# Patient Record
Sex: Male | Born: 1985 | Race: White | Hispanic: No | Marital: Single | Smoking: Current every day smoker
Health system: Southern US, Community
[De-identification: ages and names within clinical notes are randomized; demographics above are authoritative.]

---

## 2006-02-25 ENCOUNTER — Emergency Department (HOSPITAL_COMMUNITY): Admission: EM | Admit: 2006-02-25 | Discharge: 2006-02-26 | Payer: Self-pay | Admitting: Emergency Medicine

## 2006-03-06 ENCOUNTER — Emergency Department (HOSPITAL_COMMUNITY): Admission: EM | Admit: 2006-03-06 | Discharge: 2006-03-06 | Payer: Self-pay | Admitting: Emergency Medicine

## 2006-10-15 ENCOUNTER — Emergency Department (HOSPITAL_COMMUNITY): Admission: EM | Admit: 2006-10-15 | Discharge: 2006-10-15 | Payer: Self-pay | Admitting: Emergency Medicine

## 2006-10-18 ENCOUNTER — Emergency Department (HOSPITAL_COMMUNITY): Admission: EM | Admit: 2006-10-18 | Discharge: 2006-10-18 | Payer: Self-pay | Admitting: Emergency Medicine

## 2006-12-30 ENCOUNTER — Emergency Department (HOSPITAL_COMMUNITY): Admission: EM | Admit: 2006-12-30 | Discharge: 2006-12-30 | Payer: Self-pay | Admitting: Emergency Medicine

## 2007-03-07 ENCOUNTER — Emergency Department (HOSPITAL_COMMUNITY): Admission: EM | Admit: 2007-03-07 | Discharge: 2007-03-07 | Payer: Self-pay | Admitting: *Deleted

## 2007-03-11 ENCOUNTER — Emergency Department (HOSPITAL_COMMUNITY): Admission: EM | Admit: 2007-03-11 | Discharge: 2007-03-11 | Payer: Self-pay | Admitting: Emergency Medicine

## 2007-03-12 ENCOUNTER — Emergency Department (HOSPITAL_COMMUNITY): Admission: EM | Admit: 2007-03-12 | Discharge: 2007-03-12 | Payer: Self-pay | Admitting: Emergency Medicine

## 2007-08-28 ENCOUNTER — Emergency Department (HOSPITAL_COMMUNITY): Admission: EM | Admit: 2007-08-28 | Discharge: 2007-08-28 | Payer: Self-pay | Admitting: Emergency Medicine

## 2007-09-27 ENCOUNTER — Emergency Department (HOSPITAL_COMMUNITY): Admission: EM | Admit: 2007-09-27 | Discharge: 2007-09-27 | Payer: Self-pay | Admitting: Emergency Medicine

## 2008-04-06 ENCOUNTER — Emergency Department (HOSPITAL_COMMUNITY): Admission: EM | Admit: 2008-04-06 | Discharge: 2008-04-06 | Payer: Self-pay | Admitting: Emergency Medicine

## 2008-07-03 ENCOUNTER — Emergency Department (HOSPITAL_COMMUNITY): Admission: EM | Admit: 2008-07-03 | Discharge: 2008-07-03 | Payer: Self-pay | Admitting: Emergency Medicine

## 2009-03-09 ENCOUNTER — Emergency Department (HOSPITAL_COMMUNITY): Admission: EM | Admit: 2009-03-09 | Discharge: 2009-03-09 | Payer: Self-pay | Admitting: Emergency Medicine

## 2009-09-21 ENCOUNTER — Emergency Department (HOSPITAL_COMMUNITY): Admission: EM | Admit: 2009-09-21 | Discharge: 2009-09-21 | Payer: Self-pay | Admitting: Emergency Medicine

## 2010-11-21 LAB — D-DIMER, QUANTITATIVE: D-Dimer, Quant: 0.22 ug/mL-FEU (ref 0.00–0.48)

## 2010-12-31 ENCOUNTER — Emergency Department: Payer: Self-pay | Admitting: Emergency Medicine

## 2011-05-17 LAB — STREP A DNA PROBE: Group A Strep Probe: NEGATIVE

## 2011-05-17 LAB — RAPID STREP SCREEN (MED CTR MEBANE ONLY): Streptococcus, Group A Screen (Direct): NEGATIVE

## 2011-05-30 LAB — HEPATIC FUNCTION PANEL
Indirect Bilirubin: 0.8
Total Protein: 6.9

## 2011-05-30 LAB — I-STAT 8, (EC8 V) (CONVERTED LAB)
Acid-Base Excess: 1
Chloride: 103
HCT: 53 — ABNORMAL HIGH
Operator id: 285841
Sodium: 138
TCO2: 26
pCO2, Ven: 36.7 — ABNORMAL LOW
pH, Ven: 7.442 — ABNORMAL HIGH

## 2011-05-30 LAB — URINALYSIS, ROUTINE W REFLEX MICROSCOPIC
Glucose, UA: NEGATIVE
Hgb urine dipstick: NEGATIVE
Ketones, ur: NEGATIVE
Specific Gravity, Urine: 1.015

## 2011-05-30 LAB — RAPID URINE DRUG SCREEN, HOSP PERFORMED
Amphetamines: NOT DETECTED
Benzodiazepines: POSITIVE — AB
Cocaine: NOT DETECTED

## 2011-05-30 LAB — PROTIME-INR
INR: 1
Prothrombin Time: 13.8

## 2011-05-30 LAB — POCT I-STAT CREATININE: Operator id: 285841

## 2011-05-30 LAB — TRICYCLICS SCREEN, URINE: TCA Scrn: NOT DETECTED

## 2011-05-30 LAB — ACETAMINOPHEN LEVEL: Acetaminophen (Tylenol), Serum: <10 — ABNORMAL LOW

## 2012-04-13 ENCOUNTER — Emergency Department (HOSPITAL_COMMUNITY): Payer: Self-pay

## 2012-04-13 ENCOUNTER — Emergency Department (HOSPITAL_COMMUNITY)
Admission: EM | Admit: 2012-04-13 | Discharge: 2012-04-13 | Disposition: A | Discharge status: Home / Self Care | Payer: Self-pay | Attending: Emergency Medicine | Admitting: Emergency Medicine

## 2012-04-13 ENCOUNTER — Encounter (HOSPITAL_COMMUNITY): Payer: Self-pay | Admitting: *Deleted

## 2012-04-13 DIAGNOSIS — M7989 Other specified soft tissue disorders: Secondary | ICD-10-CM | POA: Insufficient documentation

## 2012-04-13 DIAGNOSIS — S9030XA Contusion of unspecified foot, initial encounter: Secondary | ICD-10-CM

## 2012-04-13 MED ORDER — IBUPROFEN 800 MG PO TABS
800.0000 mg | ORAL_TABLET | Freq: Once | ORAL | Status: AC
  Administered 2012-04-13: 800 mg via ORAL
  Filled 2012-04-13: qty 1

## 2012-04-13 MED ORDER — TRAMADOL HCL 50 MG PO TABS: 50.0000 mg | ORAL_TABLET | Freq: Four times a day (QID) | ORAL | Status: AC | PRN

## 2012-04-13 NOTE — ED Notes (Signed)
Pt reports he was chasing after his kids last night, decided to take a short cut and jumped over the rail of his deck about 18ft-he thinks he landed on his L heel.  Pt reports L foot pain-mild swelling noted.  Pt reports non-weight bearing.

## 2012-04-13 NOTE — ED Provider Notes (Signed)
History     CSN: [id number]  Arrival date & time 04/13/12  1032   First MD Initiated Contact with Patient 04/13/12 1051      Chief Complaint  Patient presents with  . Foot Injury    (Consider location/radiation/quality/duration/timing/severity/associated sxs/prior treatment) HPI Comments: Playing with children last PM.  Was barefooted and jumped over a deck railing and landed on L heel.  Has had pain since.  Unable to bear weight.  Not taking any meds.  No PCP or orthopedist.  No other injuries or complaints.  The history is provided by the patient. No language interpreter was used.    History reviewed. No pertinent past medical history.  History reviewed. No pertinent past surgical history.  No family history on file.  History  Substance Use Topics  . Smoking status: Current Everyday Smoker -- 0.5 packs/day  . Smokeless tobacco: Not on file  . Alcohol Use: Yes     occa      Review of Systems  Musculoskeletal:       Foot injury  Skin: Negative for wound.  All other systems reviewed and are negative.    Allergies  Review of patient's allergies indicates no known allergies.  Home Medications   Current Outpatient Rx  Name Route Sig Dispense Refill  . ADULT MULTIVITAMIN W/MINERALS CH Oral Take 1 tablet by mouth daily.    . TRAMADOL HCL 50 MG PO TABS Oral Take 1 tablet (50 mg total) by mouth every 6 (six) hours as needed for pain. 20 tablet 0    BP 128/80  Pulse 96  Temp 98.4 F (36.9 C) (Oral)  Resp 20  SpO2 98%  Physical Exam  Nursing note and vitals reviewed. Constitutional: He is oriented to person, place, and time. He appears well-developed and well-nourished.  HENT:  Head: Normocephalic and atraumatic.  Eyes: EOM are normal.  Neck: Normal range of motion.  Cardiovascular: Normal rate, regular rhythm, normal heart sounds and intact distal pulses.   Pulmonary/Chest: Effort normal and breath sounds normal. No respiratory distress.  Abdominal: Soft.  He exhibits no distension. There is no tenderness.  Musculoskeletal: He exhibits tenderness.       Left foot: He exhibits decreased range of motion, tenderness and bony tenderness. He exhibits no swelling, normal capillary refill, no crepitus, no deformity and no laceration.       Feet:  Neurological: He is alert and oriented to person, place, and time.  Skin: Skin is warm and dry.  Psychiatric: He has a normal mood and affect. Judgment normal.    ED Course  Procedures (including critical care time)  Labs Reviewed - No data to display Dg Foot Complete Left  04/13/2012  *RADIOLOGY REPORT*  Clinical Data: Heel pain.  LEFT FOOT - COMPLETE 3+ VIEW  Technique: Three views of the left foot.  Comparison:  None.  Findings: Anatomic alignment.  No fracture.  Soft tissues appear within normal limits.  Calcaneus appears normal.  IMPRESSION: Negative.   Original Report Authenticated By: Andreas Newport, M.D.      1. Contusion of heel       MDM  No fxs Ice, crutches rx-tramadol ibuprofen         Evalina Field, [place] 05/03/12 0050

## 2012-04-13 NOTE — ED Notes (Signed)
Pls see triage note.  Pt also reports pain is worse when his L foot is on the floor.  Pain is better with foot elevated.

## 2012-04-21 ENCOUNTER — Emergency Department: Payer: Self-pay | Admitting: Emergency Medicine

## 2012-05-03 NOTE — ED Provider Notes (Signed)
Medical screening examination/treatment/procedure(s) were performed by non-physician practitioner and as supervising physician I was immediately available for consultation/collaboration.  Korah Hufstedler R. Rishawn Walck, MD 05/03/12 1102 

## 2012-06-07 ENCOUNTER — Emergency Department: Payer: Self-pay | Admitting: Internal Medicine

## 2012-07-03 ENCOUNTER — Emergency Department: Payer: Self-pay | Admitting: Emergency Medicine

## 2013-06-09 ENCOUNTER — Emergency Department (HOSPITAL_COMMUNITY): Payer: Self-pay

## 2013-06-09 ENCOUNTER — Emergency Department (HOSPITAL_COMMUNITY)
Admission: EM | Admit: 2013-06-09 | Discharge: 2013-06-09 | Disposition: A | Discharge status: Home / Self Care | Payer: Self-pay | Attending: Emergency Medicine | Admitting: Emergency Medicine

## 2013-06-09 ENCOUNTER — Encounter (HOSPITAL_COMMUNITY): Payer: Self-pay | Admitting: Emergency Medicine

## 2013-06-09 DIAGNOSIS — Z23 Encounter for immunization: Secondary | ICD-10-CM | POA: Insufficient documentation

## 2013-06-09 DIAGNOSIS — T50905A Adverse effect of unspecified drugs, medicaments and biological substances, initial encounter: Secondary | ICD-10-CM

## 2013-06-09 DIAGNOSIS — Y929 Unspecified place or not applicable: Secondary | ICD-10-CM | POA: Insufficient documentation

## 2013-06-09 DIAGNOSIS — X58XXXA Exposure to other specified factors, initial encounter: Secondary | ICD-10-CM | POA: Insufficient documentation

## 2013-06-09 DIAGNOSIS — T400X1A Poisoning by opium, accidental (unintentional), initial encounter: Secondary | ICD-10-CM | POA: Insufficient documentation

## 2013-06-09 DIAGNOSIS — Y939 Activity, unspecified: Secondary | ICD-10-CM | POA: Insufficient documentation

## 2013-06-09 DIAGNOSIS — IMO0002 Reserved for concepts with insufficient information to code with codable children: Secondary | ICD-10-CM | POA: Insufficient documentation

## 2013-06-09 MED ORDER — TETANUS-DIPHTH-ACELL PERTUSSIS 5-2.5-18.5 LF-MCG/0.5 IM SUSP
0.5000 mL | Freq: Once | INTRAMUSCULAR | Status: AC
  Administered 2013-06-09: 0.5 mL via INTRAMUSCULAR
  Filled 2013-06-09: qty 0.5

## 2013-06-09 NOTE — ED Notes (Signed)
Patient is alert and oriented x3.  He was given DC instructions and follow up visit instructions.  Patient gave verbal understanding.  He was DC ambulatory under his own power to home.  V/S stable.  He was not showing any signs of distress on DC 

## 2013-06-09 NOTE — ED Notes (Signed)
Patient denies SI or HI.  He states that a friend gave him some pills and he took it along with 4 24oz beers.  He states that he has never taken any drugs or had this happen before.

## 2013-06-09 NOTE — ED Notes (Signed)
Patient refused all labs. 

## 2013-06-09 NOTE — ED Notes (Signed)
Patient is alert and oriented x3.  He states that he took pills from a friend.   EMS gave patient 2 of narcan with good results.  EMS placed a nasal trumpet.

## 2013-06-09 NOTE — ED Notes (Signed)
Pt. Refused lab draw. RN, Topher made aware.
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
## 2013-06-10 NOTE — ED Provider Notes (Signed)
Medical screening examination/treatment/procedure(s) were performed by non-physician practitioner and as supervising physician I was immediately available for consultation/collaboration.   Lincy Belles, MD 06/10/13 1112 

## 2014-09-28 ENCOUNTER — Encounter (HOSPITAL_COMMUNITY): Payer: Self-pay | Admitting: Emergency Medicine

## 2014-09-28 ENCOUNTER — Emergency Department (HOSPITAL_COMMUNITY)
Admission: EM | Admit: 2014-09-28 | Discharge: 2014-09-28 | Disposition: A | Discharge status: Home / Self Care | Payer: Self-pay | Attending: Emergency Medicine | Admitting: Emergency Medicine

## 2014-09-28 ENCOUNTER — Emergency Department (HOSPITAL_COMMUNITY): Payer: Self-pay

## 2014-09-28 DIAGNOSIS — Y92410 Unspecified street and highway as the place of occurrence of the external cause: Secondary | ICD-10-CM | POA: Insufficient documentation

## 2014-09-28 DIAGNOSIS — Y998 Other external cause status: Secondary | ICD-10-CM | POA: Insufficient documentation

## 2014-09-28 DIAGNOSIS — Y9389 Activity, other specified: Secondary | ICD-10-CM | POA: Insufficient documentation

## 2014-09-28 DIAGNOSIS — Z720 Tobacco use: Secondary | ICD-10-CM | POA: Insufficient documentation

## 2014-09-28 DIAGNOSIS — S29001A Unspecified injury of muscle and tendon of front wall of thorax, initial encounter: Secondary | ICD-10-CM | POA: Insufficient documentation

## 2014-09-28 DIAGNOSIS — M549 Dorsalgia, unspecified: Secondary | ICD-10-CM

## 2014-09-28 DIAGNOSIS — S3992XA Unspecified injury of lower back, initial encounter: Secondary | ICD-10-CM | POA: Insufficient documentation

## 2014-09-28 MED ORDER — HYDROMORPHONE HCL 1 MG/ML IJ SOLN
1.0000 mg | Freq: Once | INTRAMUSCULAR | Status: AC
  Administered 2014-09-28: 1 mg via INTRAMUSCULAR
  Filled 2014-09-28: qty 1

## 2014-09-28 MED ORDER — NAPROXEN 500 MG PO TABS: 500.0000 mg | ORAL_TABLET | Freq: Two times a day (BID) | ORAL | Status: DC

## 2014-09-28 MED ORDER — CYCLOBENZAPRINE HCL 10 MG PO TABS: 10.0000 mg | ORAL_TABLET | Freq: Two times a day (BID) | ORAL | Status: DC | PRN
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
## 2014-09-28 NOTE — Discharge Instructions (Signed)
X-ray was normal. You'll be sore for several days. Prescriptions for pain medicine and muscle relaxer.
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
## 2016-11-09 ENCOUNTER — Encounter (HOSPITAL_COMMUNITY): Payer: Self-pay | Admitting: Emergency Medicine

## 2016-11-09 ENCOUNTER — Emergency Department (HOSPITAL_COMMUNITY): Payer: Self-pay

## 2016-11-09 ENCOUNTER — Emergency Department (HOSPITAL_COMMUNITY)
Admission: EM | Admit: 2016-11-09 | Discharge: 2016-11-09 | Disposition: A | Discharge status: Home / Self Care | Payer: Self-pay | Attending: Emergency Medicine | Admitting: Emergency Medicine

## 2016-11-09 DIAGNOSIS — F17200 Nicotine dependence, unspecified, uncomplicated: Secondary | ICD-10-CM | POA: Insufficient documentation

## 2016-11-09 DIAGNOSIS — R109 Unspecified abdominal pain: Secondary | ICD-10-CM | POA: Insufficient documentation

## 2016-11-09 LAB — CBC
HEMATOCRIT: 45.4 % (ref 39.0–52.0)
HEMOGLOBIN: 15.4 g/dL (ref 13.0–17.0)
MCH: 29.8 pg (ref 26.0–34.0)
MCHC: 33.9 g/dL (ref 30.0–36.0)
MCV: 87.8 fL (ref 78.0–100.0)
Platelets: 259 10*3/uL (ref 150–400)
RBC: 5.17 MIL/uL (ref 4.22–5.81)
RDW: 12.9 % (ref 11.5–15.5)
WBC: 6.9 10*3/uL (ref 4.0–10.5)

## 2016-11-09 LAB — COMPREHENSIVE METABOLIC PANEL
ALK PHOS: 87 U/L (ref 38–126)
ALT: 147 U/L — ABNORMAL HIGH (ref 17–63)
ANION GAP: 9 (ref 5–15)
AST: 53 U/L — ABNORMAL HIGH (ref 15–41)
Albumin: 4.1 g/dL (ref 3.5–5.0)
BILIRUBIN TOTAL: 0.7 mg/dL (ref 0.3–1.2)
BUN: 11 mg/dL (ref 6–20)
CALCIUM: 9.2 mg/dL (ref 8.9–10.3)
CO2: 27 mmol/L (ref 22–32)
Chloride: 104 mmol/L (ref 101–111)
Creatinine, Ser: 1.12 mg/dL (ref 0.61–1.24)
GFR calc Af Amer: >60 mL/min (ref >60)
GFR calc non Af Amer: >60 mL/min (ref >60)
Glucose, Bld: 115 mg/dL — ABNORMAL HIGH (ref 65–99)
POTASSIUM: 3.7 mmol/L (ref 3.5–5.1)
Sodium: 140 mmol/L (ref 135–145)
Total Protein: 7.2 g/dL (ref 6.5–8.1)

## 2016-11-09 LAB — URINALYSIS, ROUTINE W REFLEX MICROSCOPIC
BACTERIA UA: NONE SEEN
BILIRUBIN URINE: NEGATIVE
Glucose, UA: NEGATIVE mg/dL
Ketones, ur: NEGATIVE mg/dL
LEUKOCYTES UA: NEGATIVE
Nitrite: NEGATIVE
PH: 5 (ref 5.0–8.0)
Protein, ur: 30 mg/dL — AB
Specific Gravity, Urine: 1.028 (ref 1.005–1.030)
Squamous Epithelial / LPF: NONE SEEN

## 2016-11-09 LAB — LIPASE, BLOOD: LIPASE: 20 U/L (ref 11–51)

## 2016-11-09 MED ORDER — CEPHALEXIN 500 MG PO CAPS: 500.0000 mg | ORAL_CAPSULE | Freq: Four times a day (QID) | ORAL | 0 refills | Status: AC

## 2016-11-09 MED ORDER — ACETAMINOPHEN 325 MG PO TABS
650.0000 mg | ORAL_TABLET | Freq: Once | ORAL | Status: AC
  Administered 2016-11-09: 650 mg via ORAL
  Filled 2016-11-09: qty 2

## 2016-11-09 MED ORDER — KETOROLAC TROMETHAMINE 60 MG/2ML IM SOLN
60.0000 mg | Freq: Once | INTRAMUSCULAR | Status: AC
Start: 2016-11-09 — End: 2016-11-09
  Administered 2016-11-09: 60 mg via INTRAMUSCULAR
  Filled 2016-11-09: qty 2

## 2016-11-09 MED ORDER — ONDANSETRON 4 MG PO TBDP
ORAL_TABLET | ORAL | Status: AC
  Filled 2016-11-09: qty 1

## 2016-11-09 MED ORDER — ONDANSETRON 4 MG PO TBDP
4.0000 mg | ORAL_TABLET | Freq: Once | ORAL | Status: AC | PRN
  Administered 2016-11-09: 4 mg via ORAL

## 2016-11-09 NOTE — ED Triage Notes (Signed)
Pt sts bilateral flank pain and foul smelling urine x 1 week; pt sts N/V

## 2016-11-09 NOTE — ED Notes (Signed)
Pt requesting pain medicine before he is DC. Homero FellersFrank PA aware.

## 2016-11-09 NOTE — ED Provider Notes (Signed)
[facility]-EMERGENCY DEPT Provider Note   CSN: [id number] Arrival date & time: 11/09/16  1504     History   Chief Complaint Chief Complaint  Patient presents with  . Flank Pain  . Emesis    HPI Leonard Malone. is a 31 y.o. male presents today with complaints of bilateral flank pain. He describes his pain on left flank as strong achy, worsening, constant, 8/10, with random sharp "twinge" x 1 week. He reports his right flank pain is dull, unchanged, constant, 4/10 pains x 2 days. He reports associated dark, foul smelling urine x 3 weeks. He reports associated nausea, vomiting 2 episodes, decreased intake. He denies fevers, chills, chest pain, shortness of breath, dysuria.  He does admit to suprapubic dullness that started today while moving around. He denies that feeling at this time. He's not sure if there was any blood in urine. He states he tried ibuprofen with moderate relief. He states he finds it difficult to find a comfortable position.  He denies taking any new medications. He denies any changes in physical activity. He denies exercise. He admits to recent smoking cessation. He denies hx of kidney stones, UTI. He admits to drinking about a 6 pack a day for 5-6 months last year, but states he no longer does that and last drink was about 6 weeks ago. He admits to marijuana use about 5-6 months ago and no other recent drug use. He admits to laying carpet for several years. He denies any abdominal surgeries. Patient denies recent STDs, recent unprotected sexual intercourse, penile pain, penile discharge. No recent unintentional weight loss.   The history is provided by the patient. No language interpreter was used.    History reviewed. No pertinent past medical history.  There are no active problems to display for this patient.   History reviewed. No pertinent surgical history.     Home Medications    Prior to Admission medications   Medication Sig Start Date End Date Taking?  Authorizing Provider  acetaminophen (TYLENOL) 500 MG tablet Take 1,000 mg by mouth every 6 (six) hours as needed for moderate pain.   Yes Historical Provider, MD  ibuprofen (ADVIL,MOTRIN) 200 MG tablet Take 400 mg by mouth every 6 (six) hours as needed.   Yes Historical Provider, MD  cephALEXin (KEFLEX) 500 MG capsule Take 1 capsule (500 mg total) by mouth 4 (four) times daily. 11/09/16 11/19/16  Lovey Crupi Orson Al[place], [place]    Family History History reviewed. No pertinent family history.  Social History Social History  Substance Use Topics  . Smoking status: Current Every Day Smoker    Packs/day: 0.50  . Smokeless tobacco: Not on file  . Alcohol use Yes     Comment: occa     Allergies   Patient has no known allergies.   Review of Systems Review of Systems  Constitutional: Positive for appetite change. Negative for chills and fever.  Respiratory: Negative for shortness of breath.   Cardiovascular: Negative for chest pain.  Gastrointestinal: Positive for abdominal pain. Negative for diarrhea, nausea and vomiting.  Genitourinary: Positive for frequency. Negative for difficulty urinating and dysuria.  Skin: Negative for wound.  All other systems reviewed and are negative.    Physical Exam Updated Vital Signs BP 120/80 (BP Location: Right Arm)   Pulse 75   Temp 99.6 F (37.6 C) (Oral)   Resp 18   Ht 6\' 1"  (1.854 m)   Wt 74.8 kg   SpO2 98%   BMI 21.77  kg/m   Physical Exam  Constitutional: He appears well-developed and well-nourished.  Well appearing  HENT:  Head: Normocephalic and atraumatic.  Nose: Nose normal.  Mouth/Throat: Oropharynx is clear and moist.  Eyes: Conjunctivae and EOM are normal.  Neck: Normal range of motion.  Cardiovascular: Normal rate, normal heart sounds and intact distal pulses.   No murmur heard. Pulmonary/Chest: Effort normal and breath sounds normal. No respiratory distress. He has no wheezes. He has no rales.  Normal work of breathing. No  respiratory distress noted.   Abdominal: Soft. There is tenderness. There is no rebound and no guarding.  Soft and tender to midepigastric region. Negative murphy's sign. No focal tenderness to mcburney's point. CVA tenderness noted to left side. No CVA tenderness to right side.   Musculoskeletal: Normal range of motion.  Neurological: He is alert.  Skin: Skin is warm.  Psychiatric: He has a normal mood and affect. His behavior is normal.  Nursing note and vitals reviewed.    ED Treatments / Results  Labs (all labs ordered are listed, but only abnormal results are displayed) Labs Reviewed  COMPREHENSIVE METABOLIC PANEL - Abnormal; Notable for the following:       Result Value   Glucose, Bld 115 (*)    AST 53 (*)    ALT 147 (*)    All other components within normal limits  URINALYSIS, ROUTINE W REFLEX MICROSCOPIC - Abnormal; Notable for the following:    Color, Urine AMBER (*)    Hgb urine dipstick LARGE (*)    Protein, ur 30 (*)    All other components within normal limits  URINE CULTURE  CBC  LIPASE, BLOOD    EKG  EKG Interpretation None       Radiology Ct Renal Stone Study  Result Date: 11/09/2016 CLINICAL DATA:  Acute onset of left flank pain, nausea and vomiting. Initial encounter. EXAM: CT ABDOMEN AND PELVIS WITHOUT CONTRAST TECHNIQUE: Multidetector CT imaging of the abdomen and pelvis was performed following the standard protocol without IV contrast. COMPARISON:  Pelvic radiograph performed 06/09/2013 FINDINGS: Lower chest: The visualized lung bases are grossly clear. The visualized portions of the mediastinum are unremarkable. Hepatobiliary: The liver is unremarkable in appearance. The gallbladder is unremarkable in appearance. The common bile duct remains normal in caliber. Pancreas: The pancreas is within normal limits. Spleen: The spleen is unremarkable in appearance. Adrenals/Urinary Tract: The adrenal glands are unremarkable in appearance. The kidneys are within  normal limits. There is no evidence of hydronephrosis. No renal or ureteral stones are identified. No perinephric stranding is seen. Stomach/Bowel: The stomach is unremarkable in appearance. The small bowel is within normal limits. The appendix is normal in caliber, without evidence of appendicitis. The colon is unremarkable in appearance. Vascular/Lymphatic: The abdominal aorta is unremarkable in appearance. The inferior vena cava is grossly unremarkable. No retroperitoneal lymphadenopathy is seen. No pelvic sidewall lymphadenopathy is identified. Reproductive: The bladder is mildly distended and grossly unremarkable. The prostate remains normal in size. Other: No significant soft tissue abnormalities are seen. Musculoskeletal: No acute osseous abnormalities are identified. The visualized musculature is unremarkable in appearance. IMPRESSION: Unremarkable noncontrast CT of the abdomen and pelvis. Electronically Signed   By: Roanna RaiderJeffery  Chang M.D.   On: 11/09/2016 21:21    Procedures Procedures (including critical care time)  Medications Ordered in ED Medications  ondansetron (ZOFRAN-ODT) disintegrating tablet 4 mg (4 mg Oral Given 11/09/16 1534)  ketorolac (TORADOL) injection 60 mg (60 mg Intramuscular Given 11/09/16 2002)  acetaminophen (TYLENOL)  tablet 650 mg (650 mg Oral Given 11/09/16 2210)     Initial Impression / Assessment and Plan / ED Course  I have reviewed the triage vital signs and the nursing notes.  Pertinent labs & imaging results that were available during my care of the patient were reviewed by me and considered in my medical decision making (see chart for details).    Patient here with flank pain, possible cystitis vs pyelonephritis. CT negative for kidney stone or other pathology at this time. Low suspicion for bladder CA, rhabdomyolysis, STD at this time. He is in no apparent distress, afebrile, hemodynamically stable. Patient here with mild tenderness to epigastric region. Mild  CVA tenderness to left. UA does show blood in urine. Urine culture sent. He denied any dysuria, penile pain, penile discharge, recent STDs, recent exposure to STDs. No chest pain, SOB, diaphoresis. Mild increase in AST and ALT. Patient will be given antibiotics to go home with strict instructions to follow-up with urologist for further evaluation and possible cystoscopy. Pain managed here in ED. I feel patient is safe for discharge at this time. Patient in no apparent distress, hemodynamically stable. Reasons to immediately return to ED discussed. Patient case discussed with Dr. Deretha Emory who agreed with assessment and plan.    Final Clinical Impressions(s) / ED Diagnoses   Final diagnoses:  Flank pain    New Prescriptions Discharge Medication List as of 11/09/2016  9:47 PM    START taking these medications   Details  cephALEXin (KEFLEX) 500 MG capsule Take 1 capsule (500 mg total) by mouth 4 (four) times daily., Starting Wed 11/09/2016, Until Sat 11/19/2016, Print         [address] [place], [place] 11/10/16 0017    Vanetta Mulders, MD 11/11/16 [phone]

## 2016-11-09 NOTE — Discharge Instructions (Signed)
Please take Keflex 4 times a day for 10 days. Please follow up with urologist tomorrow regarding today's visit. Please drink plenty of water throughout the day at least a glass of water. Take ibuprofen or naproxen as needed for pain relief with food.  Get help right away if: You are unable to take your antibiotics or fluids. You have shaking chills. You vomit. You have severe flank or back pain. You have extreme weakness or fainting.

## 2016-11-11 LAB — URINE CULTURE

## 2017-06-07 IMAGING — CT CT RENAL STONE PROTOCOL
2 of 4 series · 15 of 46 positions shown, 17 images · non-contrast
Comparison: Pelvic radiograph performed 06/09/2013

CLINICAL DATA: Acute onset of left flank pain, nausea and vomiting.
Initial encounter.

EXAM:
CT ABDOMEN AND PELVIS WITHOUT CONTRAST
TECHNIQUE: Multidetector CT imaging of the abdomen and pelvis was performed
following the standard protocol without IV contrast.

[Series 3: stone study 5.0 i30f 2 · axial · 0.63mm/px · z∈[+822,+1292]mm · 12 of 104 slices shown, 14 images]
[im 5/104  soft-tissue]
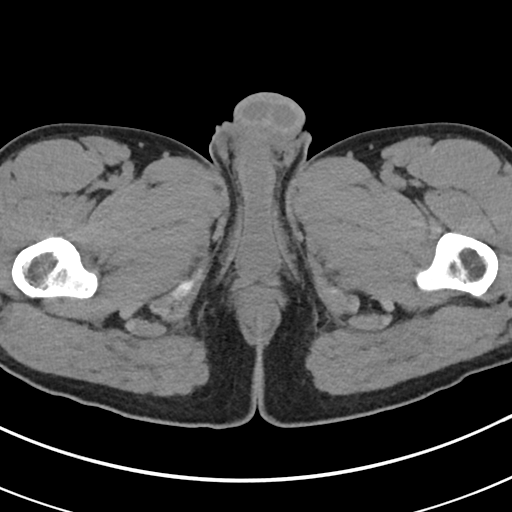
[im 5/104  bone]
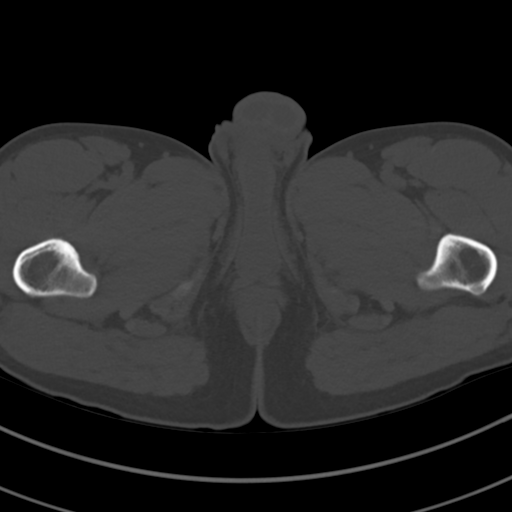
[im 13/104  soft-tissue]
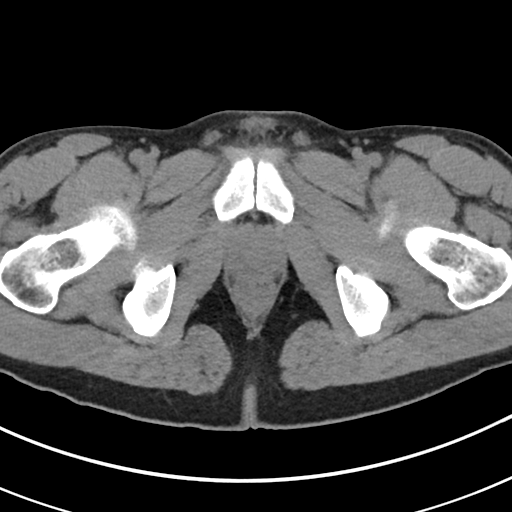
[im 22/104  soft-tissue]
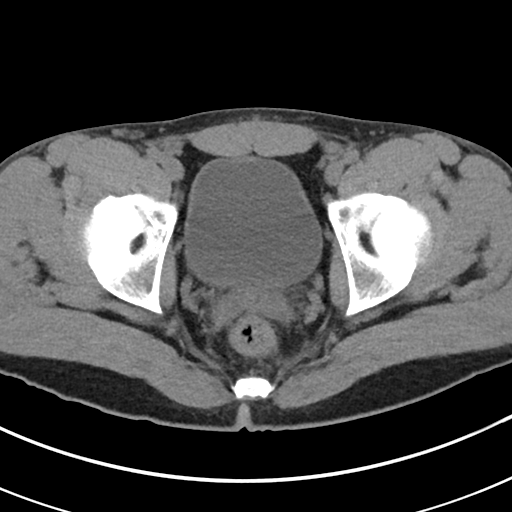
[im 31/104  soft-tissue]
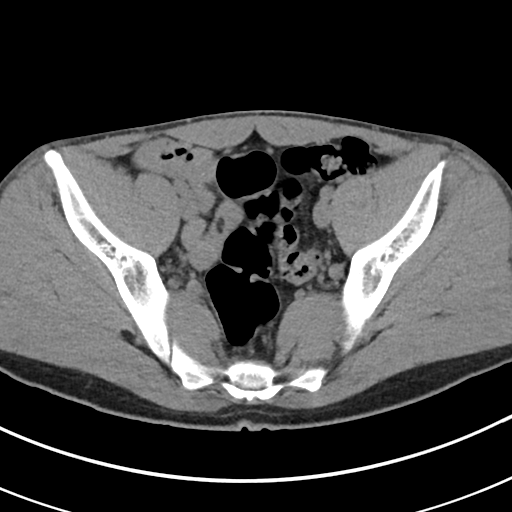
[im 39/104  soft-tissue]
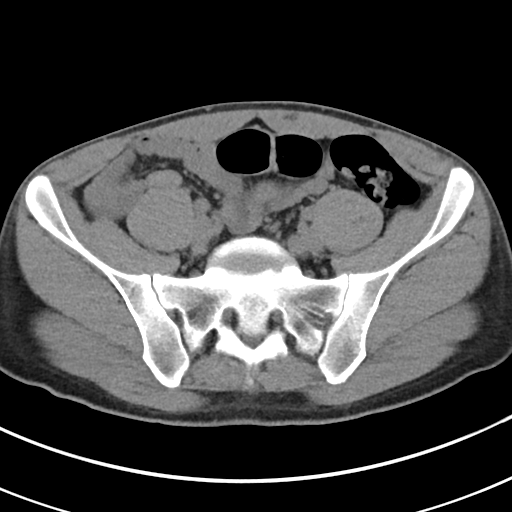
[im 48/104  soft-tissue]
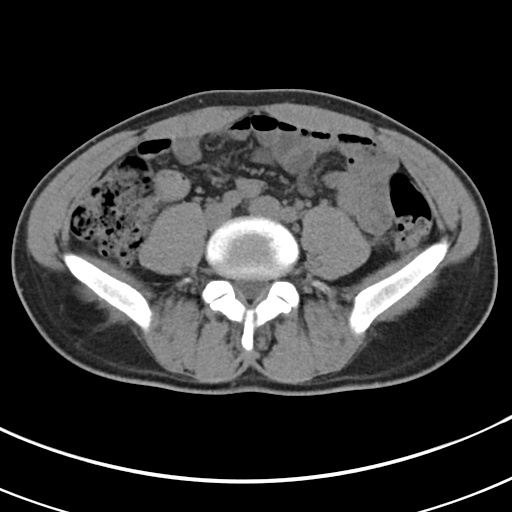
[im 56/104  soft-tissue]
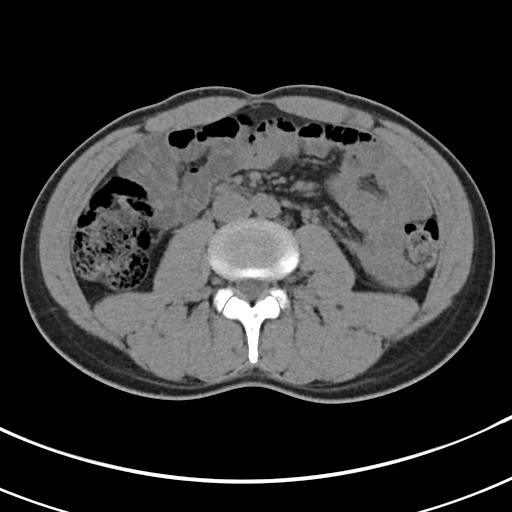
[im 65/104  soft-tissue]
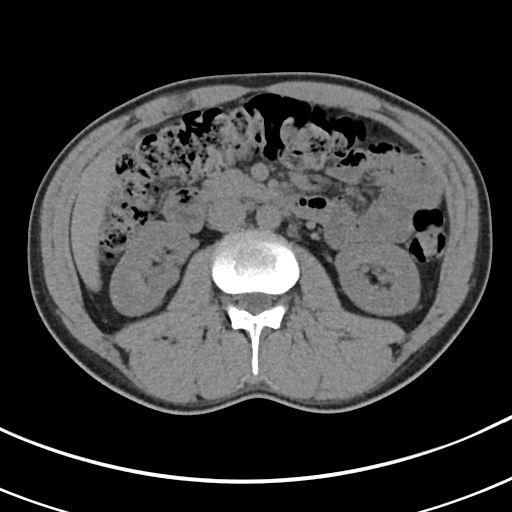
[im 73/104  soft-tissue]
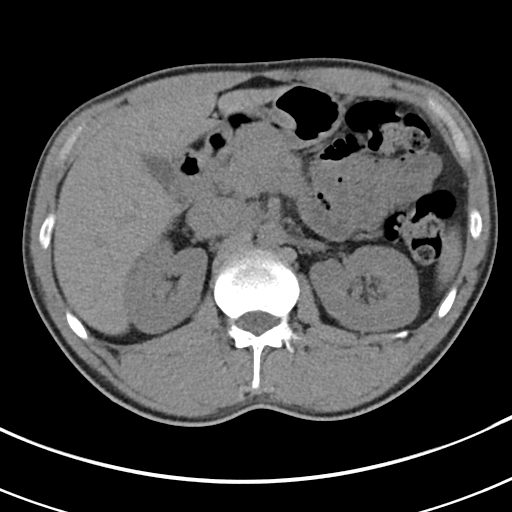
[im 73/104  bone]
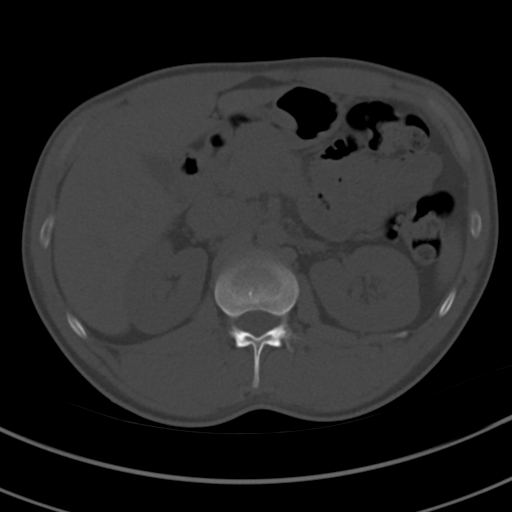
[im 82/104  soft-tissue]
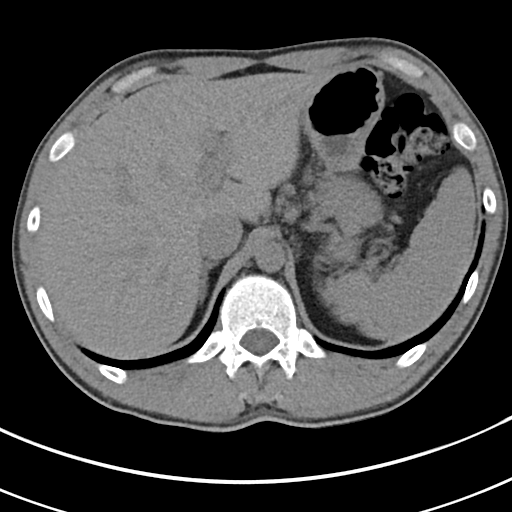
[im 91/104  soft-tissue]
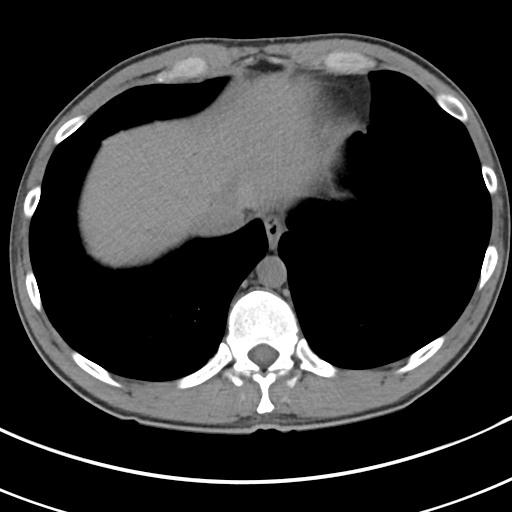
[im 99/104  soft-tissue]
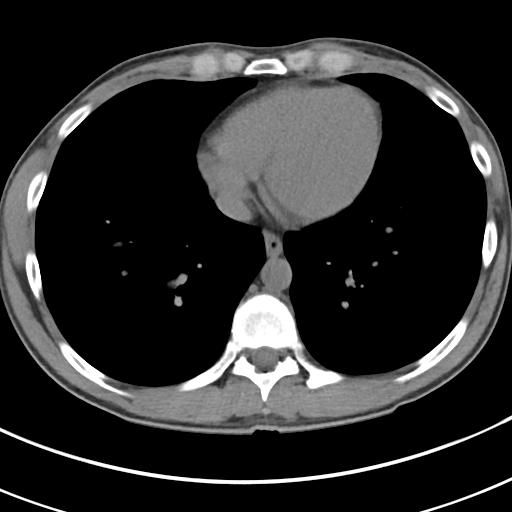

[Series 6: coronal soft tissue · coronal · 0.65mm/px · 3 of 73 slices shown]
[im 25/73  soft-tissue]
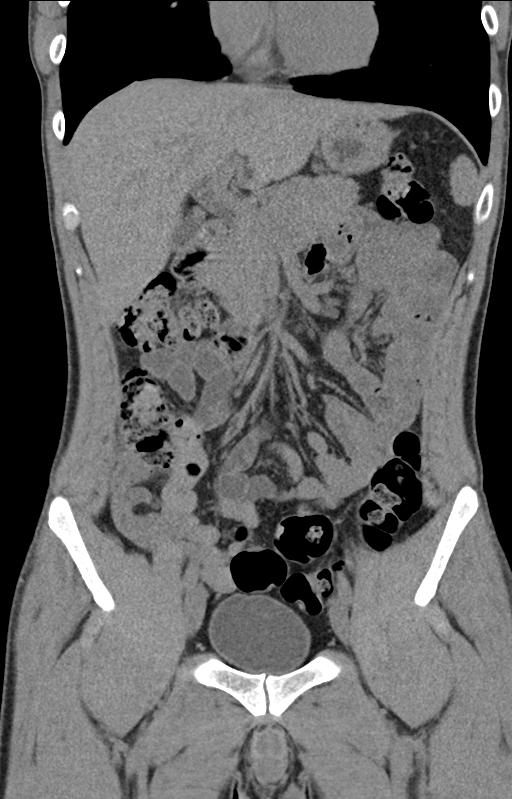
[im 33/73  soft-tissue]
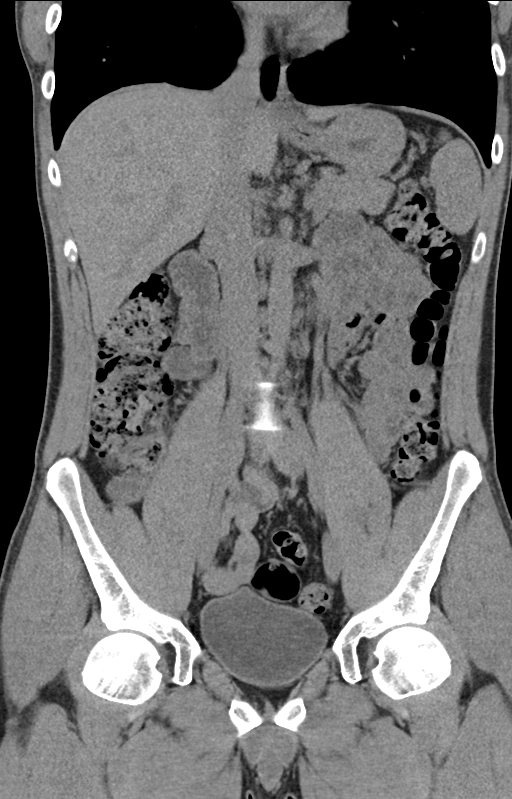
[im 41/73  soft-tissue]
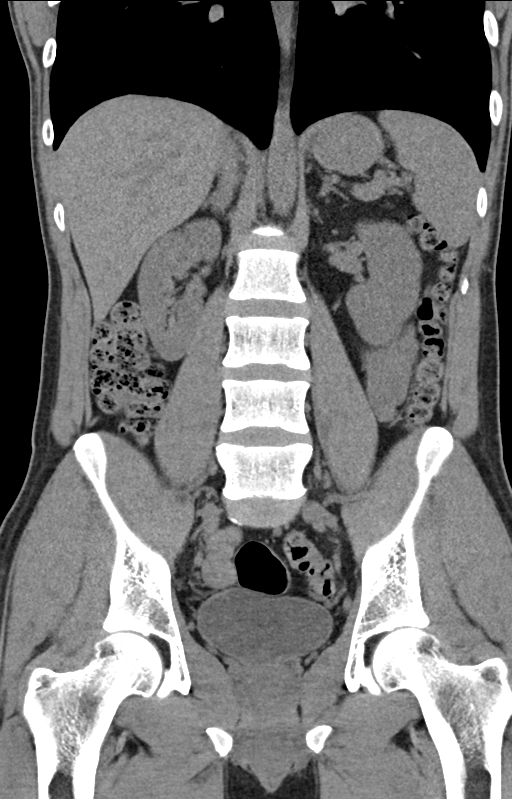

[15 of 46 positions shown; findings below may reference images not displayed]

FINDINGS: Lower chest: The visualized lung bases are grossly clear. The
visualized portions of the mediastinum are unremarkable.

Hepatobiliary: The liver is unremarkable in appearance. The
gallbladder is unremarkable in appearance. The common bile duct
remains normal in caliber.

Pancreas: The pancreas is within normal limits.

Spleen: The spleen is unremarkable in appearance.

Adrenals/Urinary Tract: The adrenal glands are unremarkable in
appearance. The kidneys are within normal limits. There is no
evidence of hydronephrosis. No renal or ureteral stones are
identified. No perinephric stranding is seen.

Stomach/Bowel: The stomach is unremarkable in appearance. The small
bowel is within normal limits. The appendix is normal in caliber,
without evidence of appendicitis. The colon is unremarkable in
appearance.

Vascular/Lymphatic: The abdominal aorta is unremarkable in
appearance. The inferior vena cava is grossly unremarkable. No
retroperitoneal lymphadenopathy is seen. No pelvic sidewall
lymphadenopathy is identified.

Reproductive: The bladder is mildly distended and grossly
unremarkable. The prostate remains normal in size.

Other: No significant soft tissue abnormalities are seen.

Musculoskeletal: No acute osseous abnormalities are identified. The
visualized musculature is unremarkable in appearance.
IMPRESSION: Unremarkable noncontrast CT of the abdomen and pelvis.

## 2017-09-30 ENCOUNTER — Encounter (HOSPITAL_COMMUNITY): Payer: Self-pay | Admitting: Emergency Medicine

## 2017-09-30 ENCOUNTER — Other Ambulatory Visit: Payer: Self-pay

## 2017-09-30 ENCOUNTER — Emergency Department (HOSPITAL_COMMUNITY)
Admission: EM | Admit: 2017-09-30 | Discharge: 2017-09-30 | Disposition: A | Discharge status: Home / Self Care | Payer: Self-pay | Attending: Emergency Medicine | Admitting: Emergency Medicine

## 2017-09-30 DIAGNOSIS — Z23 Encounter for immunization: Secondary | ICD-10-CM | POA: Insufficient documentation

## 2017-09-30 DIAGNOSIS — T23191A Burn of first degree of multiple sites of right wrist and hand, initial encounter: Secondary | ICD-10-CM | POA: Insufficient documentation

## 2017-09-30 DIAGNOSIS — X102XXA Contact with fats and cooking oils, initial encounter: Secondary | ICD-10-CM | POA: Insufficient documentation

## 2017-09-30 DIAGNOSIS — Y990 Civilian activity done for income or pay: Secondary | ICD-10-CM | POA: Insufficient documentation

## 2017-09-30 DIAGNOSIS — Y92511 Restaurant or cafe as the place of occurrence of the external cause: Secondary | ICD-10-CM | POA: Insufficient documentation

## 2017-09-30 DIAGNOSIS — T300 Burn of unspecified body region, unspecified degree: Secondary | ICD-10-CM

## 2017-09-30 DIAGNOSIS — F17210 Nicotine dependence, cigarettes, uncomplicated: Secondary | ICD-10-CM | POA: Insufficient documentation

## 2017-09-30 DIAGNOSIS — Y93G3 Activity, cooking and baking: Secondary | ICD-10-CM | POA: Insufficient documentation

## 2017-09-30 MED ORDER — SILVER SULFADIAZINE 1 % EX CREA
TOPICAL_CREAM | Freq: Once | CUTANEOUS | Status: AC
  Administered 2017-09-30: 02:00:00 via TOPICAL
  Filled 2017-09-30: qty 50

## 2017-09-30 MED ORDER — TETANUS-DIPHTH-ACELL PERTUSSIS 5-2.5-18.5 LF-MCG/0.5 IM SUSP
0.5000 mL | Freq: Once | INTRAMUSCULAR | Status: AC
  Administered 2017-09-30: 0.5 mL via INTRAMUSCULAR
  Filled 2017-09-30: qty 0.5

## 2017-09-30 NOTE — ED Triage Notes (Signed)
Pt states he was at work and grabbed a saute pan that had hot oil in it and it splashed up on his right wrist area

## 2017-09-30 NOTE — ED Provider Notes (Signed)
[facility]-EMERGENCY DEPT Provider Note   CSN: [id number] Arrival date & time: 09/30/17  0005     History   Chief Complaint Chief Complaint  Patient presents with  . Burn    HPI Reeves Musick. is a 32 y.o. male.  The history is provided by the patient and medical records.  Burn     32 y.o. M here with burn of right dorsal wrist.  States he was cooking at work with hot oil in a pan that he forgot was already too hot so he tried to jerk it off the stove real fast and it sloshed back onto his wrist.  He reports immediate pain/burning to wrist.  Date of last tetanus unknown. Dressing applied in triage.  History reviewed. No pertinent past medical history.  There are no active problems to display for this patient.   History reviewed. No pertinent surgical history.     Home Medications    Prior to Admission medications   Medication Sig Start Date End Date Taking? Authorizing Provider  acetaminophen (TYLENOL) 500 MG tablet Take 1,000 mg by mouth every 6 (six) hours as needed for moderate pain.    [provider]  ibuprofen (ADVIL,MOTRIN) 200 MG tablet Take 400 mg by mouth every 6 (six) hours as needed.    [provider]    Family History Family History  Problem Relation Age of Onset  . CAD Other     Social History Social History   Tobacco Use  . Smoking status: Current Every Day Smoker    Packs/day: 0.25    Types: Cigarettes  . Smokeless tobacco: Never Used  Substance Use Topics  . Alcohol use: Yes    Comment: occa  . Drug use: No     Allergies   Patient has no known allergies.   Review of Systems Review of Systems  Skin: Positive for wound.  All other systems reviewed and are negative.    Physical Exam Updated Vital Signs BP (!) 151/95 (BP Location: Left Arm)   Pulse (!) 106   Temp 98.3 F (36.8 C) (Oral)   Resp 18   Ht 6\' 1"  (1.854 m)   Wt 79.4 kg (175 lb)   SpO2 99%   BMI 23.09 kg/m    Physical Exam  Constitutional: He is oriented to person, place, and time. He appears well-developed and well-nourished.  HENT:  Head: Normocephalic and atraumatic.  Mouth/Throat: Oropharynx is clear and moist.  Eyes: Conjunctivae and EOM are normal. Pupils are equal, round, and reactive to light.  Neck: Normal range of motion.  Cardiovascular: Normal rate, regular rhythm and normal heart sounds.  Pulmonary/Chest: Effort normal and breath sounds normal.  Abdominal: Soft. Bowel sounds are normal.  Musculoskeletal: Normal range of motion.  Mixed first and second degree burns extending across right dorsal wrist and portions of right volar wrist; largest blister on dorsal wrist is already draining clear fluid; burn does cross joint line; maintains full ROM of the wrist and all fingers; no apparent contractures; normal distal sensation and perfusion; no burns noted to the fingers or palm TBSA affected <2-3%  Neurological: He is alert and oriented to person, place, and time.  Skin: Skin is warm and dry.  Psychiatric: He has a normal mood and affect.  Nursing note and vitals reviewed.        ED Treatments / Results  Labs (all labs ordered are listed, but only abnormal results are displayed) Labs Reviewed - No  data to display  EKG  EKG Interpretation None       Radiology No results found.  Procedures Procedures (including critical care time)  Medications Ordered in ED Medications  Tdap (BOOSTRIX) injection 0.5 mL (0.5 mLs Intramuscular Given 09/30/17 0137)  silver sulfADIAZINE (SILVADENE) 1 % cream ( Topical Given 09/30/17 0149)     Initial Impression / Assessment and Plan / ED Course  I have reviewed the triage vital signs and the nursing notes.  Pertinent labs & imaging results that were available during my care of the patient were reviewed by me and considered in my medical decision making (see chart for details).  32 year old male here with a burn of right wrist and  distal forearm.  This happened from hot oil and a sautee pan at work.  He has mixed first and second-degree burns of right dorsal wrist extending slightly to the volar wrist but there is no finger or pulmonary involvement.  The largest blister on the dorsal wrist seems to have ruptured and is already draining clear fluid.  I do not appreciate any signs of superimposed infection.  He maintains full range of motion of the wrist and all fingers without any apparent contractures.  The TBSA affected < 2-3%.  Patient unsure about tetanus status, updated here.  Silvadene cream applied sterile dressing.  Discussed home wound care.  Given follow-up with wound care center.  He understands to return here for any new/acute changes.  Final Clinical Impressions(s) / ED Diagnoses   Final diagnoses:  Burn    ED Discharge Orders    None       Garlon HatchetSanders, Hemi Chacko M, PA-C 09/30/17 0225    Ward, Layla MawKristen N, DO 09/30/17 [id number]

## 2017-09-30 NOTE — Discharge Instructions (Signed)
Continue using silvadene cream twice daily. Keep area clean and dry best you can.  Change dressings at least once daily. You can follow-up with wound care center if you have any ongoing issues. Return to the ED for new or worsening symptoms.

## 2022-03-26 ENCOUNTER — Other Ambulatory Visit: Payer: Self-pay

## 2022-03-26 DIAGNOSIS — F15159 Other stimulant abuse with stimulant-induced psychotic disorder, unspecified: Secondary | ICD-10-CM | POA: Insufficient documentation

## 2022-03-26 DIAGNOSIS — F32A Depression, unspecified: Secondary | ICD-10-CM | POA: Insufficient documentation

## 2022-03-26 DIAGNOSIS — F22 Delusional disorders: Secondary | ICD-10-CM | POA: Insufficient documentation

## 2022-03-26 DIAGNOSIS — R45851 Suicidal ideations: Secondary | ICD-10-CM | POA: Insufficient documentation

## 2022-03-26 DIAGNOSIS — F17210 Nicotine dependence, cigarettes, uncomplicated: Secondary | ICD-10-CM | POA: Insufficient documentation

## 2022-03-26 DIAGNOSIS — F1210 Cannabis abuse, uncomplicated: Secondary | ICD-10-CM | POA: Insufficient documentation

## 2022-03-26 NOTE — ED Triage Notes (Signed)
Reports suicidal with thoughts of wanting to turn bike into traffic. Reports no intent to follow through but is concerned about the ideation. Pt also reports paranoia that people are trying to kill him. Pt reports hearing threatening voices.   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 Pt states he feels that the paranoia is real "but even if it's not it's the meth, I need help with the psychosis and the SI."   Pt is calm and cooperative in triage but notably anxious.   Pt breathing unlabored speaking in full sentences. Denies chest pain or SOB.

## 2022-03-27 ENCOUNTER — Emergency Department
Admission: EM | Admit: 2022-03-27 | Discharge: 2022-03-27 | Disposition: A | Discharge status: Home / Self Care | Payer: Self-pay | Attending: Emergency Medicine | Admitting: Emergency Medicine

## 2022-03-27 DIAGNOSIS — F1510 Other stimulant abuse, uncomplicated: Secondary | ICD-10-CM

## 2022-03-27 DIAGNOSIS — F1910 Other psychoactive substance abuse, uncomplicated: Secondary | ICD-10-CM | POA: Insufficient documentation

## 2022-03-27 DIAGNOSIS — R45851 Suicidal ideations: Secondary | ICD-10-CM

## 2022-03-27 DIAGNOSIS — F329 Major depressive disorder, single episode, unspecified: Secondary | ICD-10-CM

## 2022-03-27 DIAGNOSIS — F331 Major depressive disorder, recurrent, moderate: Secondary | ICD-10-CM

## 2022-03-27 DIAGNOSIS — F28 Other psychotic disorder not due to a substance or known physiological condition: Secondary | ICD-10-CM

## 2022-03-27 LAB — COMPREHENSIVE METABOLIC PANEL
ALT: 41 U/L (ref 0–44)
AST: 41 U/L (ref 15–41)
Albumin: 4.6 g/dL (ref 3.5–5.0)
Alkaline Phosphatase: 88 U/L (ref 38–126)
Anion gap: 13 (ref 5–15)
BUN: 17 mg/dL (ref 6–20)
CO2: 24 mmol/L (ref 22–32)
Calcium: 9.3 mg/dL (ref 8.9–10.3)
Chloride: 98 mmol/L (ref 98–111)
Creatinine, Ser: 1.03 mg/dL (ref 0.61–1.24)
GFR, Estimated: >60 mL/min (ref >60)
Glucose, Bld: 102 mg/dL — ABNORMAL HIGH (ref 70–99)
Potassium: 4.2 mmol/L (ref 3.5–5.1)
Sodium: 135 mmol/L (ref 135–145)
Total Bilirubin: 1.1 mg/dL (ref 0.3–1.2)
Total Protein: 8.1 g/dL (ref 6.5–8.1)

## 2022-03-27 LAB — CBC
HCT: 44.2 % (ref 39.0–52.0)
Hemoglobin: 14.8 g/dL (ref 13.0–17.0)
MCH: 31.1 pg (ref 26.0–34.0)
MCHC: 33.5 g/dL (ref 30.0–36.0)
MCV: 92.9 fL (ref 80.0–100.0)
Platelets: 233 10*3/uL (ref 150–400)
RBC: 4.76 MIL/uL (ref 4.22–5.81)
RDW: 13.2 % (ref 11.5–15.5)
WBC: 9.8 10*3/uL (ref 4.0–10.5)
nRBC: 0 % (ref 0.0–0.2)

## 2022-03-27 LAB — URINE DRUG SCREEN, QUALITATIVE (ARMC ONLY)
Amphetamines, Ur Screen: POSITIVE — AB
Barbiturates, Ur Screen: NOT DETECTED
Benzodiazepine, Ur Scrn: NOT DETECTED
Cannabinoid 50 Ng, Ur ~~LOC~~: POSITIVE — AB
Cocaine Metabolite,Ur ~~LOC~~: NOT DETECTED
MDMA (Ecstasy)Ur Screen: NOT DETECTED
Methadone Scn, Ur: NOT DETECTED
Opiate, Ur Screen: NOT DETECTED
Phencyclidine (PCP) Ur S: NOT DETECTED
Tricyclic, Ur Screen: NOT DETECTED

## 2022-03-27 LAB — ETHANOL: Alcohol, Ethyl (B): <10 mg/dL (ref <10)

## 2022-03-27 LAB — SALICYLATE LEVEL: Salicylate Lvl: <7 mg/dL — ABNORMAL LOW (ref 7.0–30.0)

## 2022-03-27 LAB — ACETAMINOPHEN LEVEL: Acetaminophen (Tylenol), Serum: <10 ug/mL — ABNORMAL LOW (ref 10–30)

## 2022-03-27 MED ORDER — LORAZEPAM 2 MG PO TABS
2.0000 mg | ORAL_TABLET | Freq: Once | ORAL | Status: AC
  Administered 2022-03-27: 2 mg via ORAL
  Filled 2022-03-27: qty 1

## 2022-03-27 NOTE — ED Notes (Signed)
E-signature not working at this time. Pt verbalized understanding of D/C instructions, prescriptions and follow up care with no further questions at this time. Pt in NAD and ambulatory at time of D/C.  

## 2022-03-27 NOTE — ED Notes (Signed)
Vol / psych consult ordered and complete

## 2022-03-27 NOTE — Consult Note (Signed)
[facility] Face-to-Face Psychiatry Consult   Reason for Consult:  Psych eval Referring Physician:  Dr. Katrinka Blazing Patient Identification: Leonard Malone. MRN:  [id number] Principal Diagnosis: <principal problem not specified> Diagnosis:  Active Problems:   Methamphetamine abuse (HCC)   Total Time spent with patient: 45 minutes  Subjective:   Paranoia related to methamphetamine use  HPI:  Leonard Malone., 36 y.o., male patient seen  by TTS and this provider; chart reviewed and consulted with Dr. Katrinka Blazing on 03/27/22.  Per triage note, pt reports suicidal with thoughts of wanting to turn bike into traffic. Reports no intent to follow through but is concerned about the ideation. Pt also reports paranoia that people are trying to kill him.  Pt reports hearing threatening voices.    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 Pt states he feels that the paranoia is real "but even if it's not it's the meth, I need help with the psychosis and the SI."  Pt is calm and cooperative in triage but notably anxious.   On evaluation Leonard Malone. Reports that he has used meth, drank and smoked marijuana two days ago and as a result, feels suicidal and paranoid.  He states that the feelings were worst two days ago and thought he would be able to go to work but then the paranoid feelings came back tonight.  He denies feelings of wanting to hurt himself at this time but still says he still feel slight affects.  He says he doesn't use meth often but that lately he has been feeling stressed out with life.    Patient states he is willing to follow-up with out patient care.  Recommendations:  Overnight obs and reassess in the AM.    Past Psychiatric History: depression and substance abuse  Risk to Self:   Risk to Others:   Prior Inpatient Therapy:   Prior Outpatient Therapy:    Past Medical History: No past medical history on file. No past surgical history on  file. Family History:  Family History  Problem Relation Age of Onset   CAD Other    Family Psychiatric  History:  Social History:  Social History   Substance and Sexual Activity  Alcohol Use Yes   Comment: occa     Social History   Substance and Sexual Activity  Drug Use Yes   Types: Methamphetamines, IV   Comment: heroin    Social History   Socioeconomic History   Marital status: Single    Spouse name: Not on file   Number of children: Not on file   Years of education: Not on file   Highest education level: Not on file  Occupational History   Not on file  Tobacco Use   Smoking status: Every Day    Packs/day: 0.25    Types: Cigarettes   Smokeless tobacco: Never  Vaping Use   Vaping Use: Every day  Substance and Sexual Activity   Alcohol use: Yes    Comment: occa   Drug use: Yes    Types: Methamphetamines, IV    Comment: heroin   Sexual activity: Not on file  Other Topics Concern   Not on file  Social History Narrative   Not on file   Social Determinants of Health   Financial Resource Strain: Not on file  Food Insecurity: Not on file  Transportation Needs: Not on file  Physical Activity: Not on file  Stress:  Not on file  Social Connections: Not on file   Additional Social History:    Allergies:  No Known Allergies  Labs:  Results for orders placed or performed during the hospital encounter of 03/27/22 (from the past 48 hour(s))  Comprehensive metabolic panel     Status: Abnormal   Collection Time: 03/26/22 11:58 PM  Result Value Ref Range   Sodium 135 135 - 145 mmol/L   Potassium 4.2 3.5 - 5.1 mmol/L   Chloride 98 98 - 111 mmol/L   CO2 24 22 - 32 mmol/L   Glucose, Bld 102 (H) 70 - 99 mg/dL    Comment: Glucose reference range applies only to samples taken after fasting for at least 8 hours.   BUN 17 6 - 20 mg/dL   Creatinine, Ser 5.88 0.61 - 1.24 mg/dL   Calcium 9.3 8.9 - 50.2 mg/dL   Total Protein 8.1 6.5 - 8.1 g/dL   Albumin 4.6 3.5 - 5.0  g/dL   AST 41 15 - 41 U/L   ALT 41 0 - 44 U/L   Alkaline Phosphatase 88 38 - 126 U/L   Total Bilirubin 1.1 0.3 - 1.2 mg/dL   GFR, Estimated >77 >41 mL/min    Comment: (NOTE) Calculated using the CKD-EPI Creatinine Equation (2021)    Anion gap 13 5 - 15    Comment: Performed at [facility], [address], [place], [place] [address]  Ethanol     Status: None   Collection Time: 03/26/22 11:58 PM  Result Value Ref Range   Alcohol, Ethyl (B) <10 <10 mg/dL    Comment: (NOTE) Lowest detectable limit for serum alcohol is 10 mg/dL.  For medical purposes only. Performed at [facility], [address] [place], [place] [address]   Salicylate level     Status: Abnormal   Collection Time: 03/26/22 11:58 PM  Result Value Ref Range   Salicylate Lvl <7.0 (L) 7.0 - 30.0 mg/dL    Comment: Performed at [facility], [address] [place], [place] [address]  Acetaminophen level     Status: Abnormal   Collection Time: 03/26/22 11:58 PM  Result Value Ref Range   Acetaminophen (Tylenol), Serum <10 (L) 10 - 30 ug/mL    Comment: (NOTE) Therapeutic concentrations vary significantly. A range of 10-30 ug/mL  may be an effective concentration for many patients. However, some  are best treated at concentrations outside of this range. Acetaminophen concentrations >150 ug/mL at 4 hours after ingestion  and >50 ug/mL at 12 hours after ingestion are often associated with  toxic reactions.  Performed at [facility], [address] [place], [place] [address]   cbc     Status: None   Collection Time: 03/26/22 11:58 PM  Result Value Ref Range   WBC 9.8 4.0 - 10.5 K/uL   RBC 4.76 4.22 - 5.81 MIL/uL   Hemoglobin 14.8 13.0 - 17.0 g/dL   HCT 62.9 47.6 - 54.6 %   MCV 92.9 80.0 - 100.0 fL   MCH 31.1 26.0 - 34.0 pg   MCHC 33.5 30.0 - 36.0 g/dL   RDW 50.3 54.6 - 56.8 %   Platelets 233 150 - 400 K/uL   nRBC 0.0 0.0 - 0.2 %    Comment: Performed at [facility], [address], [place], [place] [address]  Urine Drug Screen, Qualitative     Status: Abnormal   Collection Time: 03/26/22 11:58 PM  Result Value Ref Range  Tricyclic, Ur Screen NONE DETECTED NONE DETECTED   Amphetamines, Ur Screen POSITIVE (A) NONE DETECTED   MDMA (Ecstasy)Ur Screen NONE DETECTED NONE DETECTED   Cocaine Metabolite,Ur [place] NONE DETECTED NONE DETECTED   Opiate, Ur Screen NONE DETECTED NONE DETECTED   Phencyclidine (PCP) Ur S NONE DETECTED NONE DETECTED   Cannabinoid 50 Ng, Ur [place] POSITIVE (A) NONE DETECTED   Barbiturates, Ur Screen NONE DETECTED NONE DETECTED   Benzodiazepine, Ur Scrn NONE DETECTED NONE DETECTED   Methadone Scn, Ur NONE DETECTED NONE DETECTED    Comment: (NOTE) Tricyclics + metabolites, urine    Cutoff 1000 ng/mL Amphetamines + metabolites, urine  Cutoff 1000 ng/mL MDMA (Ecstasy), urine              Cutoff 500 ng/mL Cocaine Metabolite, urine          Cutoff 300 ng/mL Opiate + metabolites, urine        Cutoff 300 ng/mL Phencyclidine (PCP), urine         Cutoff 25 ng/mL Cannabinoid, urine                 Cutoff 50 ng/mL Barbiturates + metabolites, urine  Cutoff 200 ng/mL Benzodiazepine, urine              Cutoff 200 ng/mL Methadone, urine                   Cutoff 300 ng/mL  The urine drug screen provides only a preliminary, unconfirmed analytical test result and should not be used for non-medical purposes. Clinical consideration and professional judgment should be applied to any positive drug screen result due to possible interfering substances. A more specific alternate chemical method must be used in order to obtain a confirmed analytical result. Gas chromatography / mass spectrometry (GC/MS) is the preferred confirm atory method. Performed at [facility], [address] [place], [place] [address]     No current facility-administered medications for this encounter.   Current Outpatient Medications  Medication Sig  Dispense Refill   acetaminophen (TYLENOL) 500 MG tablet Take 1,000 mg by mouth every 6 (six) hours as needed for moderate pain.     ibuprofen (ADVIL,MOTRIN) 200 MG tablet Take 400 mg by mouth every 6 (six) hours as needed.      Musculoskeletal: Strength & Muscle Tone: within normal limits Gait & Station: normal Patient leans: N/A  Psychiatric Specialty Exam:  Presentation  General Appearance: Appropriate for Environment  Eye Contact:Fair  Speech:Clear and Coherent  Speech Volume:Normal  Handedness:Right   Mood and Affect  Mood:Anxious  Affect:Appropriate; Congruent   Thought Process  Thought Processes:Coherent  Descriptions of Associations:Intact  Orientation:Full (Time, Place and Person)  Thought Content:Logical; WDL  History of Schizophrenia/Schizoaffective disorder:No data recorded Duration of Psychotic Symptoms:No data recorded Hallucinations:Hallucinations: None  Ideas of Reference:None  Suicidal Thoughts:Suicidal Thoughts: No  Homicidal Thoughts:Homicidal Thoughts: No   Sensorium  Memory:Immediate Fair  Judgment:Fair  Insight:Fair   Executive Functions  Concentration:Fair  Attention Span:Fair  [place]   Psychomotor Activity  Psychomotor Activity:Psychomotor Activity: Normal   Assets  Assets:Communication Skills; Desire for Improvement; Housing   Sleep  Sleep:Sleep: Fair   Physical Exam: Physical Exam Vitals and nursing note reviewed.  HENT:     Head: Normocephalic and atraumatic.     Nose: Nose normal.     Mouth/Throat:     Mouth: Mucous membranes are dry.  Eyes:     Pupils: Pupils are equal, round, and  reactive to light.  Abdominal:     General: Abdomen is flat.  Musculoskeletal:        General: Normal range of motion.     Cervical back: Normal range of motion.  Skin:    General: Skin is warm and dry.  Neurological:     General: No focal deficit present.     Mental  Status: He is alert and oriented to person, place, and time.  Psychiatric:        Attention and Perception: Attention and perception normal.        Mood and Affect: Mood is depressed.        Speech: Speech normal.        Behavior: Behavior is cooperative.        Thought Content: Thought content is paranoid.        Cognition and Memory: Cognition and memory normal.        Judgment: Judgment normal.    Review of Systems  Psychiatric/Behavioral:  Positive for depression, substance abuse and suicidal ideas.   All other systems reviewed and are negative.  Blood pressure (!) 147/101, pulse 84, temperature 98.7 F (37.1 C), temperature source Oral, resp. rate 18, height 6' (1.829 m), weight 72.6 kg, SpO2 100 %. Body mass index is 21.7 kg/m.   Disposition: No evidence of imminent risk to self or others at present.   Patient does not meet criteria for psychiatric inpatient admission. Discussed crisis plan, support from social network, calling 911, coming to the Emergency Department, and calling Suicide Hotline. Dc in the AM  Jearld Lesch, NP 03/27/2022 3:09 AM

## 2022-03-27 NOTE — ED Notes (Signed)
Pt given a cup of ice water

## 2022-03-27 NOTE — ED Provider Notes (Signed)
Pt seen by psychiatry, and has been cleared for dc. On my assessment patient is alert and oriented, appropriate. Lives with his parents. I think that he is appropriate for dc at this time. Will give [facility] f/u.    Georga Hacking, MD 03/27/22 [phone]

## 2022-03-27 NOTE — ED Notes (Signed)
Patient to ED after using meth and having si and feeling like people are out to kill him.  Patient reports similar episode approximately 6 years ago after using meth.

## 2022-03-27 NOTE — ED Provider Notes (Signed)
[facility] Provider Note    Event Date/Time   First MD Initiated Contact with Patient 03/27/22 0034     (approximate)   History   Suicidal and Paranoid   HPI  Leonard Malone. is a 36 y.o. male who presents to the ED for evaluation of Suicidal and Paranoid   Patient presents to the ED for evaluation of paranoia and intermittent suicidal thoughts after using methamphetamine a couple days ago.  He reports being clean for many years, but previously having an affinity for heroin, but occasionally using methamphetamines in the past.  Reports increased home stressors such that he did use intravenous and inhaled methamphetamines 2 days ago, and since then has been having whispering auditory hallucinations and "dark thoughts."  Denies any plans to harm himself and has nothing formulated.  Physical Exam   Triage Vital Signs: ED Triage Vitals  Enc Vitals Group     BP 03/26/22 2346 (!) 147/101     Pulse Rate 03/26/22 2346 84     Resp 03/26/22 2346 18     Temp 03/26/22 2346 98.7 F (37.1 C)     Temp Source 03/26/22 2346 Oral     SpO2 03/26/22 2346 100 %     Weight 03/26/22 2347 160 lb (72.6 kg)     Height 03/26/22 2347 6' (1.829 m)     Head Circumference --      Peak Flow --      Pain Score 03/26/22 2347 0     Pain Loc --      Pain Edu? --      Excl. in GC? --     Most recent vital signs: Vitals:   03/26/22 2346  BP: (!) 147/101  Pulse: 84  Resp: 18  Temp: 98.7 F (37.1 C)  SpO2: 100%    General: Awake, no distress.  CV:  Good peripheral perfusion.  Resp:  Normal effort.  Abd:  No distention.  MSK:  No deformity noted.  Neuro:  No focal deficits appreciated. Other:     ED Results / Procedures / Treatments   Labs (all labs ordered are listed, but only abnormal results are displayed) Labs Reviewed  COMPREHENSIVE METABOLIC PANEL - Abnormal; Notable for the following components:      Result Value   Glucose, Bld 102 (*)    All other  components within normal limits  SALICYLATE LEVEL - Abnormal; Notable for the following components:   Salicylate Lvl <7.0 (*)    All other components within normal limits  ACETAMINOPHEN LEVEL - Abnormal; Notable for the following components:   Acetaminophen (Tylenol), Serum <10 (*)    All other components within normal limits  URINE DRUG SCREEN, QUALITATIVE ([facility] ONLY) - Abnormal; Notable for the following components:   Amphetamines, Ur Screen POSITIVE (*)    Cannabinoid 50 Ng, Ur [place] POSITIVE (*)    All other components within normal limits  ETHANOL  CBC    EKG   RADIOLOGY   Official radiology report(s): No results found.  PROCEDURES and INTERVENTIONS:  Procedures  Medications  LORazepam (ATIVAN) tablet 2 mg (2 mg Oral Given 03/27/22 0136)     IMPRESSION / MDM / ASSESSMENT AND PLAN / ED COURSE  I reviewed the triage vital signs and the nursing notes.  Differential diagnosis includes, but is not limited to, polysubstance abuse, sympathomimetic overdose, acute psychoses, medication noncompliance, malingering  {Patient presents with symptoms of an acute illness or injury that is potentially life-threatening.  36 year old male presents to the ED with suicidal thoughts, without any formulated plans and requiring psychiatric evaluation.  Look systemically well overall, has linear thought processes and no current suicidal thoughts or plans.  Screening blood work is benign with normal CBC, CMP, negative ethanol.  UDS confirms feta means and cannabis.  We will provide some oral benzodiazepines, closely monitor reassess as well as consult psychiatry.  No clear indications to IVC the patient at this point.  Suspect this is related to his methamphetamine use.  Clinical Course as of 03/27/22 0151  Wynelle Link Mar 27, 2022  0114 The patient has been placed in psychiatric observation due to the need to provide a safe environment for the patient while obtaining psychiatric consultation and  evaluation, as well as ongoing medical and medication management to treat the patient's condition.  The patient has not been placed under full IVC at this time.   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   Delton Prairie, MD 03/27/22 [phone]

## 2022-03-27 NOTE — BH Assessment (Signed)
Comprehensive Clinical Assessment (CCA) Note  03/27/2022 Leonard Malone [id number]  Chief Complaint: Patient is a 36 year old male presenting to [facility] ED voluntarily. Per triage note Reports suicidal with thoughts of wanting to turn bike into traffic. Reports no intent to follow through but is concerned about the ideation. Pt also reports paranoia that people are trying to kill him. Pt reports hearing threatening voices. 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 Pt states he feels that the paranoia is real "but even if it's not it's the meth, I need help with the psychosis and the SI." During assessment patient appears alert and oriented x4, calm and cooperative. Patient reports "I had suicidal thoughts." Patient reports last using  Amphetamines 2 days ago, patient's UDS positive for Amphetamines and Cannabinoids. Patient reports that he does not normally use Amphetamines and reports his last use being "6-7 years ago." Patient reports when he did use 6-7 years ago he also reports paranoia. Patient reports in the past he went to [facility] for anxiety medication but denies any current medications.  Per Psyc NP Lerry Liner patient to be reassessed  Chief Complaint  Patient presents with   Suicidal   Paranoid   Visit Diagnosis: Methamphetamine abuse, Depression    CCA Screening, Triage and Referral (STR)  Patient Reported Information How did you hear about [place]? Self  Referral name: No data recorded Referral phone number: No data recorded  Whom do you see for routine medical problems? No data recorded Practice/Facility Name: No data recorded Practice/Facility Phone Number: No data recorded Name of Contact: No data recorded Contact Number: No data recorded Contact Fax Number: No data recorded Prescriber Name: No data recorded Prescriber Address (if known): No data recorded  What Is the Reason for Your Visit/Call Today? Reports suicidal with  thoughts of wanting to turn bike into traffic. Reports no intent to follow through but is concerned about the ideation. Pt also reports paranoia that people are trying to kill him. Pt reports hearing threatening voices.      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 Pt states he feels that the paranoia is real "but even if it's not it's the meth, I need help with the psychosis and the SI."  How Long Has This Been Causing You Problems? > than 6 months  What Do You Feel Would Help You the Most Today? No data recorded  Have You Recently Been in Any Inpatient Treatment (Hospital/Detox/Crisis Center/28-Day Program)? No data recorded Name/Location of Program/Hospital:No data recorded How Long Were You There? No data recorded When Were You Discharged? No data recorded  Have You Ever Received Services From [facility] Before? No data recorded Who Do You See at [facility]? No data recorded  Have You Recently Had Any Thoughts About Hurting Yourself? Yes  Are You Planning to Commit Suicide/Harm Yourself At This time? No   Have you Recently Had Thoughts About Hurting Someone Karolee Ohs? No  Explanation: No data recorded  Have You Used Any Alcohol or Drugs in the Past 24 Hours? Yes  How Long Ago Did You Use Drugs or Alcohol? No data recorded What Did You Use and How Much? Amphetamines   Do You Currently Have a Therapist/Psychiatrist? No  Name of Therapist/Psychiatrist: No data recorded  Have You Been Recently Discharged From Any Office Practice or Programs? No  Explanation of Discharge From Practice/Program: No data  recorded    CCA Screening Triage Referral Assessment Type of Contact: Face-to-Face  Is this Initial or Reassessment? No data recorded Date Telepsych consult ordered in [facility]:  No data recorded Time Telepsych consult ordered in [facility]:  No data recorded  Patient Reported Information Reviewed? No data recorded Patient Left Without Being Seen? No  data recorded Reason for Not Completing Assessment: No data recorded  Collateral Involvement: No data recorded  Does Patient Have a Court Appointed Legal Guardian? No data recorded Name and Contact of Legal Guardian: No data recorded If Minor and Not Living with Parent(s), Who has Custody? No data recorded Is CPS involved or ever been involved? Never  Is APS involved or ever been involved? Never   Patient Determined To Be At Risk for Harm To Self or Others Based on Review of Patient Reported Information or Presenting Complaint? No  Method: No data recorded Availability of Means: No data recorded Intent: No data recorded Notification Required: No data recorded Additional Information for Danger to Others Potential: No data recorded Additional Comments for Danger to Others Potential: No data recorded Are There Guns or Other Weapons in Your Home? No data recorded Types of Guns/Weapons: No data recorded Are These Weapons Safely Secured?                            No data recorded Who Could Verify You Are Able To Have These Secured: No data recorded Do You Have any Outstanding Charges, Pending Court Dates, Parole/Probation? No data recorded Contacted To Inform of Risk of Harm To Self or Others: No data recorded  Location of Assessment: [facility] ED   Does Patient Present under Involuntary Commitment? No  IVC Papers Initial File Date: No data recorded  [place] of Residence: [place]   Patient Currently Receiving the Following Services: No data recorded  Determination of Need: Emergent (2 hours)   Options For Referral: No data recorded    CCA Biopsychosocial Intake/Chief Complaint:  No data recorded Current Symptoms/Problems: No data recorded  Patient Reported Schizophrenia/Schizoaffective Diagnosis in Past: No   Strengths: Patient is able to communicate  Preferences: No data recorded Abilities: No data recorded  Type of Services Patient Feels are Needed: No data  recorded  Initial Clinical Notes/Concerns: No data recorded  Mental Health Symptoms Depression:   Change in energy/activity   Duration of Depressive symptoms:  Less than two weeks   Mania:   None   Anxiety:    Difficulty concentrating; Restlessness; Worrying   Psychosis:   Delusions   Duration of Psychotic symptoms:  Less than six months   Trauma:   None   Obsessions:   None   Compulsions:   None   Inattention:   None   Hyperactivity/Impulsivity:   None   Oppositional/Defiant Behaviors:   None   Emotional Irregularity:   None   Other Mood/Personality Symptoms:  No data recorded   Mental Status Exam Appearance and self-care  Stature:   Average   Weight:   Average weight   Clothing:   Casual   Grooming:   Normal   Cosmetic use:   None   Posture/gait:   Normal   Motor activity:   Not Remarkable   Sensorium  Attention:   Normal   Concentration:   Normal   Orientation:   X5   Recall/memory:   Normal   Affect and Mood  Affect:   Appropriate   Mood:   Anxious  Relating  Eye contact:   Normal   Facial expression:   Anxious   Attitude toward examiner:   Cooperative   Thought and Language  Speech flow:  Clear and Coherent   Thought content:   Appropriate to Mood and Circumstances   Preoccupation:   None   Hallucinations:   None   Organization:  No data recorded  [employer] of Knowledge:   Fair   Intelligence:   Average   Abstraction:   Functional   Judgement:   Impaired   Reality Testing:   Adequate   Insight:   Fair   Decision Making:   Normal   Social Functioning  Social Maturity:   Responsible   Social Judgement:   Normal   Stress  Stressors:   Office manager Ability:   Contractor Deficits:   None   Supports:   Family     Religion: Religion/Spirituality Are You A Religious Person?: No  Leisure/Recreation: Leisure / Recreation Do You  Have Hobbies?: No  Exercise/Diet: Exercise/Diet Do You Exercise?: No Have You Gained or Lost A Significant Amount of Weight in the Past Six Months?: No Do You Follow a Special Diet?: No Do You Have Any Trouble Sleeping?: No   CCA Employment/Education Employment/Work Situation: Employment / Work Situation Employment Situation: Employed Work Stressors: None reported Patient's Job has Been Impacted by Current Illness: No Has Patient ever Been in Equities trader?: No  Education: Education Is Patient Currently Attending School?: No Did Theme park manager?: No Did You Have An Individualized Education Program (IIEP): No Did You Have Any Difficulty At [employer]?: No Patient's Education Has Been Impacted by Current Illness: No   CCA Family/Childhood History Family and Relationship History: Family history Marital status: Single Does patient have children?: Yes How many children?:  (Unknown) How is patient's relationship with their children?: Patient reports that his children live with their mother  Childhood History:  Childhood History Did patient suffer any verbal/emotional/physical/sexual abuse as a child?: No Did patient suffer from severe childhood neglect?: No Has patient ever been sexually abused/assaulted/raped as an adolescent or adult?: No Was the patient ever a victim of a crime or a disaster?: No Witnessed domestic violence?: No Has patient been affected by domestic violence as an adult?: No  Child/Adolescent Assessment:     CCA Substance Use Alcohol/Drug Use: Alcohol / Drug Use Pain Medications: See MAR Prescriptions: See MAR Over the Counter: See MAR History of alcohol / drug use?: Yes Substance #1 Name of Substance 1: Amphetamines 1 - Last Use / Amount: Patient reports last use "2 days ago"                       ASAM's:  Six Dimensions of Multidimensional Assessment  Dimension 1:  Acute Intoxication and/or Withdrawal Potential:      Dimension 2:   Biomedical Conditions and Complications:      Dimension 3:  Emotional, Behavioral, or Cognitive Conditions and Complications:     Dimension 4:  Readiness to Change:     Dimension 5:  Relapse, Continued use, or Continued Problem Potential:     Dimension 6:  Recovery/Living Environment:     ASAM Severity Score:    ASAM Recommended Level of Treatment:     Substance use Disorder (SUD)    Recommendations for Services/Supports/Treatments:    DSM5 Diagnoses: Patient Active Problem List   Diagnosis Date Noted   Methamphetamine abuse (HCC) 03/27/2022  MDD (major depressive disorder) 03/27/2022   Methamphetamine use (HCC)    Polysubstance abuse [facility])     Patient Centered Plan: Patient is on the following Treatment Plan(s):  Depression and Substance Abuse   Referrals to Alternative Service(s): Referred to Alternative Service(s):   Place:   Date:   Time:    Referred to Alternative Service(s):   Place:   Date:   Time:    Referred to Alternative Service(s):   Place:   Date:   Time:    Referred to Alternative Service(s):   Place:   Date:   Time:      @[facility]COLLABOFCARE @  , LCAS-A

## 2022-03-27 NOTE — ED Notes (Signed)
Pt dressed into blue paper hospital scrubs. Pts belongings placed in belongings bag to be locked up. Pts belongings include a green t-shirt, black pants, black shoes, black belt, white socks, gray underwear, and a brown and black backpack.
